# Patient Record
Sex: Female | Born: 1955 | Marital: Single
Health system: Southern US, Community
[De-identification: ages and names within clinical notes are randomized; demographics above are authoritative.]

## PROBLEM LIST (undated history)

## (undated) DIAGNOSIS — I2510 Atherosclerotic heart disease of native coronary artery without angina pectoris: Secondary | ICD-10-CM

## (undated) DIAGNOSIS — I219 Acute myocardial infarction, unspecified: Secondary | ICD-10-CM

## (undated) HISTORY — DX: Atherosclerotic heart disease of native coronary artery without angina pectoris: I25.10

---

## 1999-05-12 ENCOUNTER — Other Ambulatory Visit: Admission: RE | Admit: 1999-05-12 | Discharge: 1999-05-12 | Payer: Self-pay | Admitting: Obstetrics and Gynecology

## 2000-07-11 ENCOUNTER — Other Ambulatory Visit: Admission: RE | Admit: 2000-07-11 | Discharge: 2000-07-11 | Payer: Self-pay | Admitting: Obstetrics and Gynecology

## 2001-08-31 ENCOUNTER — Other Ambulatory Visit: Admission: RE | Admit: 2001-08-31 | Discharge: 2001-08-31 | Payer: Self-pay | Admitting: Obstetrics and Gynecology

## 2002-10-09 ENCOUNTER — Other Ambulatory Visit: Admission: RE | Admit: 2002-10-09 | Discharge: 2002-10-09 | Payer: Self-pay | Admitting: Obstetrics and Gynecology

## 2003-10-10 ENCOUNTER — Other Ambulatory Visit: Admission: RE | Admit: 2003-10-10 | Discharge: 2003-10-10 | Payer: Self-pay | Admitting: Obstetrics and Gynecology

## 2004-10-21 ENCOUNTER — Other Ambulatory Visit: Admission: RE | Admit: 2004-10-21 | Discharge: 2004-10-21 | Payer: Self-pay | Admitting: Obstetrics and Gynecology

## 2005-10-25 ENCOUNTER — Other Ambulatory Visit: Admission: RE | Admit: 2005-10-25 | Discharge: 2005-10-25 | Payer: Self-pay | Admitting: Obstetrics and Gynecology

## 2011-01-20 ENCOUNTER — Ambulatory Visit: Payer: BC Managed Care – PPO | Admitting: Physical Therapy

## 2015-03-18 ENCOUNTER — Other Ambulatory Visit: Payer: Self-pay | Admitting: Obstetrics and Gynecology

## 2015-03-18 DIAGNOSIS — Z780 Asymptomatic menopausal state: Secondary | ICD-10-CM

## 2015-03-18 DIAGNOSIS — R1011 Right upper quadrant pain: Secondary | ICD-10-CM

## 2015-03-25 ENCOUNTER — Ambulatory Visit
Admission: RE | Admit: 2015-03-25 | Discharge: 2015-03-25 | Disposition: A | Discharge status: Home / Self Care | Payer: BLUE CROSS/BLUE SHIELD | Source: Ambulatory Visit | Attending: Obstetrics and Gynecology | Admitting: Obstetrics and Gynecology

## 2015-03-25 ENCOUNTER — Other Ambulatory Visit: Payer: Self-pay

## 2015-03-25 DIAGNOSIS — Z780 Asymptomatic menopausal state: Secondary | ICD-10-CM

## 2019-10-01 ENCOUNTER — Other Ambulatory Visit: Payer: Self-pay

## 2019-10-01 DIAGNOSIS — Z20822 Contact with and (suspected) exposure to covid-19: Secondary | ICD-10-CM

## 2019-10-03 LAB — NOVEL CORONAVIRUS, NAA: SARS-CoV-2, NAA: NOT DETECTED

## 2020-06-11 ENCOUNTER — Other Ambulatory Visit: Payer: Self-pay | Admitting: Obstetrics & Gynecology

## 2020-06-11 DIAGNOSIS — R928 Other abnormal and inconclusive findings on diagnostic imaging of breast: Secondary | ICD-10-CM

## 2020-06-19 ENCOUNTER — Ambulatory Visit
Admission: RE | Admit: 2020-06-19 | Discharge: 2020-06-19 | Disposition: A | Discharge status: Home / Self Care | Payer: BLUE CROSS/BLUE SHIELD | Source: Ambulatory Visit | Attending: Obstetrics & Gynecology | Admitting: Obstetrics & Gynecology

## 2020-06-19 ENCOUNTER — Ambulatory Visit: Payer: BLUE CROSS/BLUE SHIELD

## 2020-06-19 ENCOUNTER — Other Ambulatory Visit: Payer: Self-pay

## 2020-06-19 DIAGNOSIS — R928 Other abnormal and inconclusive findings on diagnostic imaging of breast: Secondary | ICD-10-CM

## 2020-09-09 ENCOUNTER — Encounter: Payer: Self-pay | Admitting: Physician Assistant

## 2020-09-09 ENCOUNTER — Telehealth: Payer: Self-pay | Admitting: Physician Assistant

## 2020-09-09 DIAGNOSIS — I2510 Atherosclerotic heart disease of native coronary artery without angina pectoris: Secondary | ICD-10-CM | POA: Insufficient documentation

## 2020-09-09 NOTE — Telephone Encounter (Signed)
Called to discuss with patient about Covid symptoms and the use of casirivimab/imdevimab, a monoclonal antibody infusion for those with mild to moderate Covid symptoms and at a high risk of hospitalization.  Pt is qualified for this infusion at the [facility] due to; Specific high risk criteria : Cardiovascular disease or hypertension   Message left to call back our hotline [phone] and sent mychart msg.  Cline Crock PA-C  [facility]

## 2020-09-10 ENCOUNTER — Other Ambulatory Visit: Payer: Self-pay | Admitting: Physician Assistant

## 2020-09-10 ENCOUNTER — Other Ambulatory Visit (HOSPITAL_COMMUNITY): Payer: Self-pay

## 2020-09-10 ENCOUNTER — Telehealth: Payer: Self-pay | Admitting: Physician Assistant

## 2020-09-10 DIAGNOSIS — I2510 Atherosclerotic heart disease of native coronary artery without angina pectoris: Secondary | ICD-10-CM

## 2020-09-10 DIAGNOSIS — U071 COVID-19: Secondary | ICD-10-CM

## 2020-09-10 DIAGNOSIS — I10 Essential (primary) hypertension: Secondary | ICD-10-CM

## 2020-09-10 NOTE — Telephone Encounter (Signed)
I connected by phone with Elizabeth Parker on 09/10/2020 at 4:33 PM to discuss the potential use of a new treatment for mild to moderate COVID-19 viral infection in non-hospitalized patients.  This patient is a 64 y.o. female that meets the FDA criteria for Emergency Use Authorization of COVID monoclonal antibody casirivimab/imdevimab.  Has a (+) direct SARS-CoV-2 viral test result  Has mild or moderate COVID-19   Is NOT hospitalized due to COVID-19  Is within 10 days of symptom onset  Has at least one of the high risk factor(s) for progression to severe COVID-19 and/or hospitalization as defined in EUA.  Specific high risk criteria : Cardiovascular disease or hypertension   I have spoken and communicated the following to the patient or parent/caregiver regarding COVID monoclonal antibody treatment:  1. FDA has authorized the emergency use for the treatment of mild to moderate COVID-19 in adults and pediatric patients with positive results of direct SARS-CoV-2 viral testing who are 79 years of age and older weighing at least 40 kg, and who are at high risk for progressing to severe COVID-19 and/or hospitalization.  2. The significant known and potential risks and benefits of COVID monoclonal antibody, and the extent to which such potential risks and benefits are unknown.  3. Information on available alternative treatments and the risks and benefits of those alternatives, including clinical trials.  4. Patients treated with COVID monoclonal antibody should continue to self-isolate and use infection control measures (e.g., wear mask, isolate, social distance, avoid sharing personal items, clean and disinfect high touch surfaces, and frequent handwashing) according to CDC guidelines.   5. The patient or parent/caregiver has the option to accept or refuse COVID monoclonal antibody treatment.  After reviewing this information with the patient, the patient has agreed to receive one of  the available covid 19 monoclonal antibodies and will be provided an appropriate fact sheet prior to infusion.  Sx onset 10/2. Set up for infusion on 10/8 @ 8:30am . Directions given to [facility]. Pt is aware that insurance will be charged an infusion fee. Pt is unvaccinated.   Cline Crock 09/10/2020 4:33 PM

## 2020-09-11 ENCOUNTER — Ambulatory Visit (HOSPITAL_COMMUNITY)
Admission: RE | Admit: 2020-09-11 | Discharge: 2020-09-11 | Disposition: A | Discharge status: Home / Self Care | Payer: BLUE CROSS/BLUE SHIELD | Source: Ambulatory Visit | Attending: Pulmonary Disease | Admitting: Pulmonary Disease

## 2020-09-11 DIAGNOSIS — U071 COVID-19: Secondary | ICD-10-CM | POA: Diagnosis present

## 2020-09-11 DIAGNOSIS — I10 Essential (primary) hypertension: Secondary | ICD-10-CM | POA: Insufficient documentation

## 2020-09-11 DIAGNOSIS — I2510 Atherosclerotic heart disease of native coronary artery without angina pectoris: Secondary | ICD-10-CM | POA: Diagnosis present

## 2020-09-11 MED ORDER — ALBUTEROL SULFATE HFA 108 (90 BASE) MCG/ACT IN AERS: 2.0000 | INHALATION_SPRAY | Freq: Once | RESPIRATORY_TRACT | Status: DC | PRN

## 2020-09-11 MED ORDER — METHYLPREDNISOLONE SODIUM SUCC 125 MG IJ SOLR: 125.0000 mg | Freq: Once | INTRAMUSCULAR | Status: DC | PRN

## 2020-09-11 MED ORDER — SODIUM CHLORIDE 0.9 % IV SOLN: Freq: Once | INTRAVENOUS | Status: AC

## 2020-09-11 MED ORDER — FAMOTIDINE IN NACL 20-0.9 MG/50ML-% IV SOLN: 20.0000 mg | Freq: Once | INTRAVENOUS | Status: DC | PRN

## 2020-09-11 MED ORDER — EPINEPHRINE 0.3 MG/0.3ML IJ SOAJ: 0.3000 mg | Freq: Once | INTRAMUSCULAR | Status: DC | PRN

## 2020-09-11 MED ORDER — SODIUM CHLORIDE 0.9 % IV SOLN: INTRAVENOUS | Status: DC | PRN

## 2020-09-11 MED ORDER — DIPHENHYDRAMINE HCL 50 MG/ML IJ SOLN: 50.0000 mg | Freq: Once | INTRAMUSCULAR | Status: DC | PRN

## 2020-09-11 NOTE — Progress Notes (Signed)
  Diagnosis: COVID-19   Physician: dr Delford Field   Procedure: [facility] Med: casirivimab\imdevimab infusion - Provided patient with casirivimab\imdevimab fact sheet for patients, parents and caregivers prior to infusion.  Complications: No immediate complications noted.  Discharge: Discharged home   Shaune Spittle 09/11/2020

## 2020-09-11 NOTE — Discharge Instructions (Signed)

## 2020-11-05 IMAGING — MG MM DIGITAL DIAGNOSTIC UNILAT*L* W/ TOMO W/ CAD
4 series · 4 of 12 positions shown · non-contrast
Comparison: Previous exam(s).

CLINICAL DATA: Patient was called back from 2D mammogram for
possible asymmetry in the left breast.

EXAM:
DIGITAL DIAGNOSTIC UNILATERAL LEFT MAMMOGRAM WITH TOMO AND CAD

[L CC synth-2D]
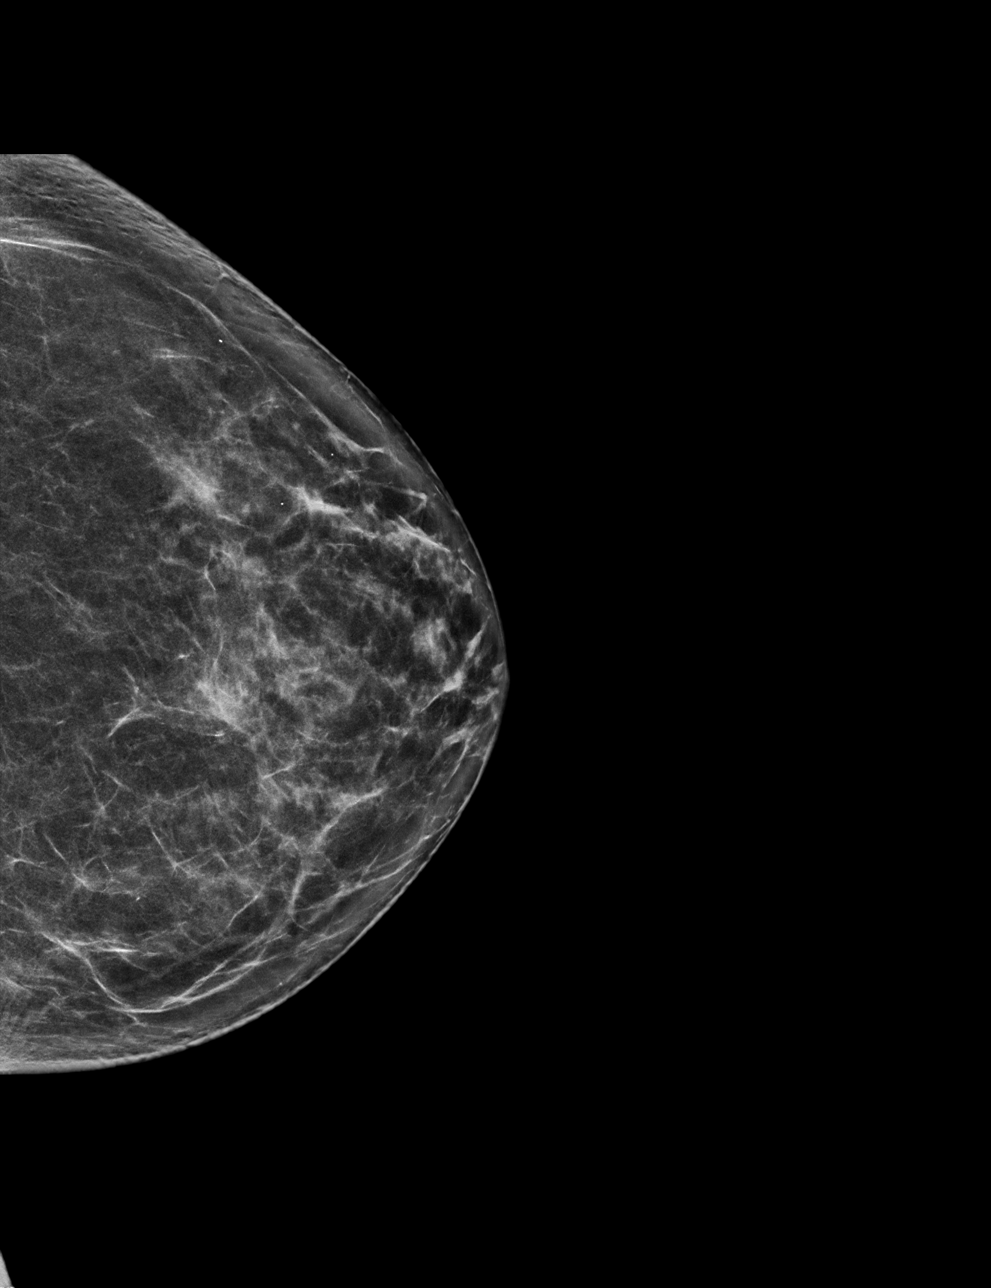

[L MLO synth-2D]
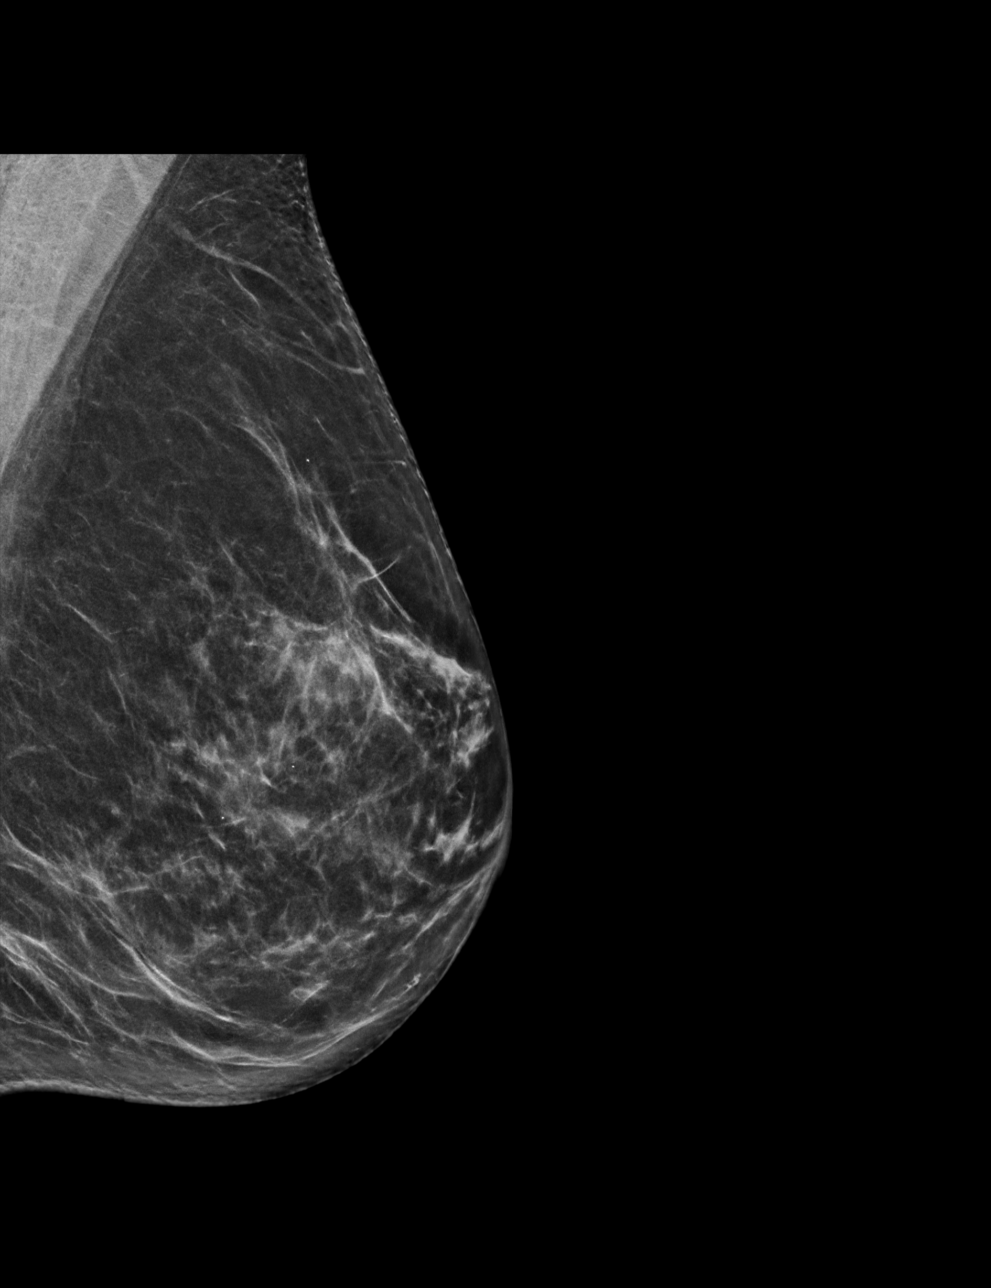

[L CC tomo · tomo slice 34/67.0]
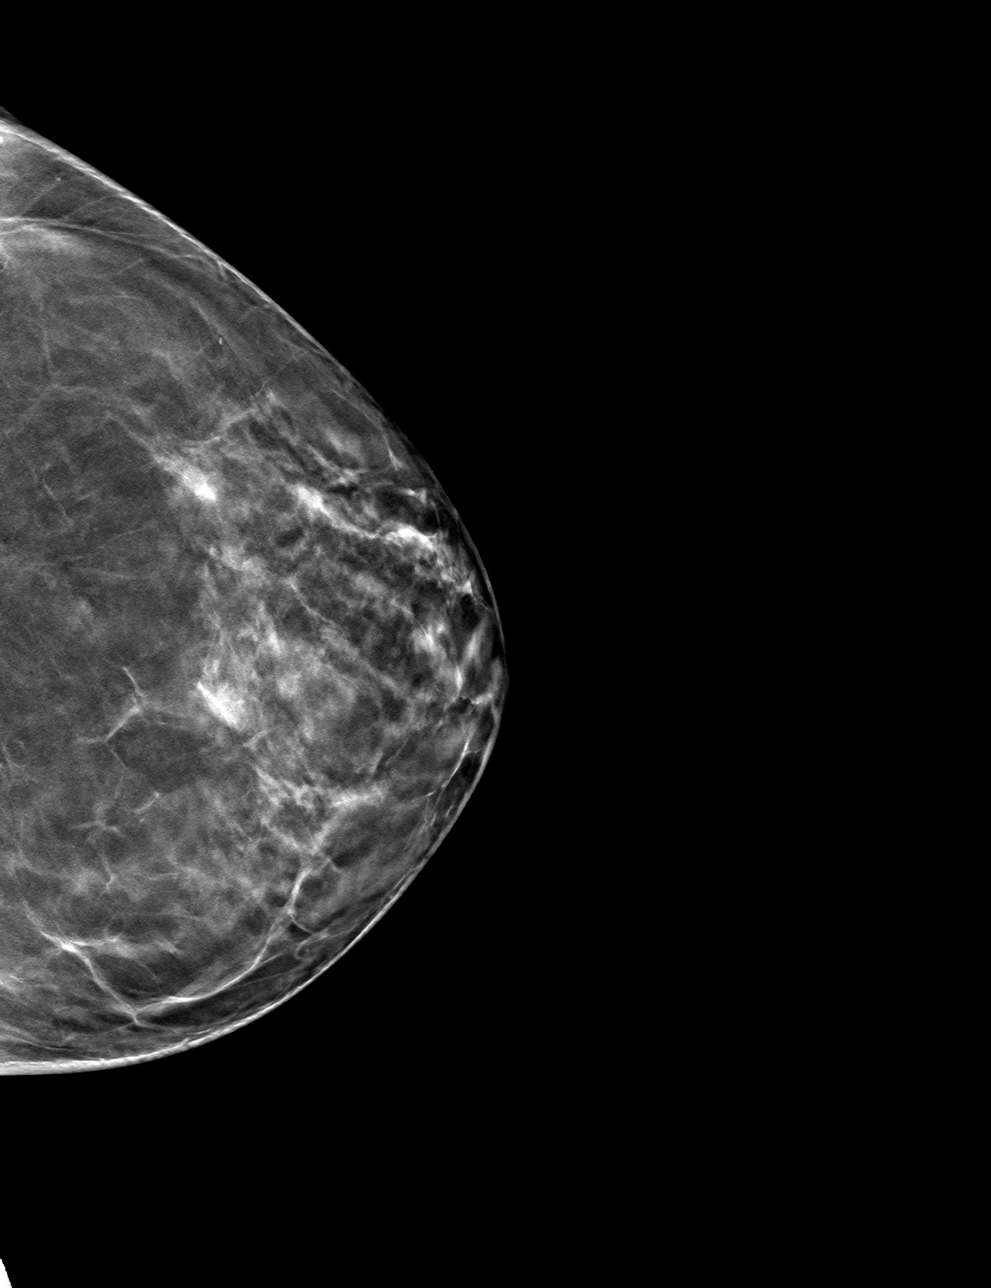

[L MLO tomo · tomo slice 31/61.0]
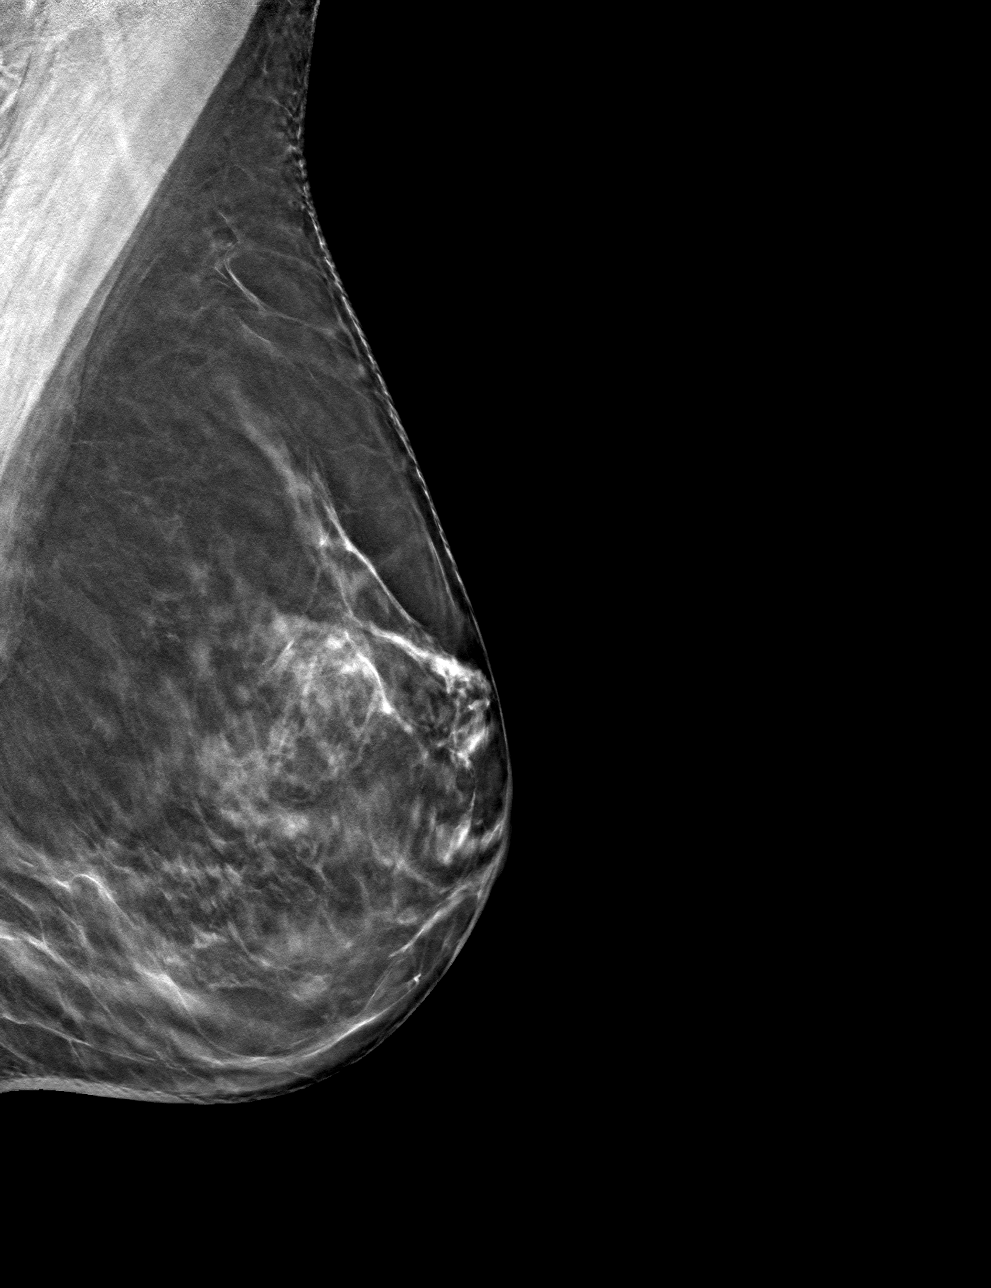

[4 of 12 positions shown; findings below may reference images not displayed]

ACR Breast Density Category b: There are scattered areas of
fibroglandular density.
FINDINGS: 3D imaging of the left breast was performed. No suspicious mass,
malignant type microcalcifications or distortion detected.

Mammographic images were processed with CAD.
IMPRESSION: No evidence of malignancy in the left breast.

RECOMMENDATION:
Bilateral screening mammogram in 1 year is recommended.

I have discussed the findings and recommendations with the patient.
If applicable, a reminder letter will be sent to the patient
regarding the next appointment.

BI-RADS CATEGORY  1: Negative.

## 2022-06-01 DIAGNOSIS — Z955 Presence of coronary angioplasty implant and graft: Secondary | ICD-10-CM | POA: Diagnosis not present

## 2022-06-01 DIAGNOSIS — I252 Old myocardial infarction: Secondary | ICD-10-CM | POA: Diagnosis not present

## 2022-06-01 DIAGNOSIS — E7800 Pure hypercholesterolemia, unspecified: Secondary | ICD-10-CM | POA: Diagnosis not present

## 2022-06-01 DIAGNOSIS — I2510 Atherosclerotic heart disease of native coronary artery without angina pectoris: Secondary | ICD-10-CM | POA: Diagnosis not present

## 2022-07-21 DIAGNOSIS — Z6822 Body mass index (BMI) 22.0-22.9, adult: Secondary | ICD-10-CM | POA: Diagnosis not present

## 2022-07-21 DIAGNOSIS — Z01419 Encounter for gynecological examination (general) (routine) without abnormal findings: Secondary | ICD-10-CM | POA: Diagnosis not present

## 2022-07-21 DIAGNOSIS — Z1231 Encounter for screening mammogram for malignant neoplasm of breast: Secondary | ICD-10-CM | POA: Diagnosis not present

## 2022-08-15 DIAGNOSIS — M8580 Other specified disorders of bone density and structure, unspecified site: Secondary | ICD-10-CM | POA: Diagnosis not present

## 2022-08-15 DIAGNOSIS — M8589 Other specified disorders of bone density and structure, multiple sites: Secondary | ICD-10-CM | POA: Diagnosis not present

## 2022-08-15 DIAGNOSIS — E2839 Other primary ovarian failure: Secondary | ICD-10-CM | POA: Diagnosis not present

## 2022-11-21 DIAGNOSIS — Z1211 Encounter for screening for malignant neoplasm of colon: Secondary | ICD-10-CM | POA: Diagnosis not present

## 2022-11-29 LAB — EXTERNAL GENERIC LAB PROCEDURE: COLOGUARD: NEGATIVE

## 2022-11-29 LAB — COLOGUARD: COLOGUARD: NEGATIVE

## 2022-11-30 DIAGNOSIS — Z112 Encounter for screening for other bacterial diseases: Secondary | ICD-10-CM | POA: Diagnosis not present

## 2023-01-23 DIAGNOSIS — H5203 Hypermetropia, bilateral: Secondary | ICD-10-CM | POA: Diagnosis not present

## 2023-01-23 DIAGNOSIS — H524 Presbyopia: Secondary | ICD-10-CM | POA: Diagnosis not present

## 2023-01-23 DIAGNOSIS — H52223 Regular astigmatism, bilateral: Secondary | ICD-10-CM | POA: Diagnosis not present

## 2023-01-30 DIAGNOSIS — L821 Other seborrheic keratosis: Secondary | ICD-10-CM | POA: Diagnosis not present

## 2023-01-30 DIAGNOSIS — L720 Epidermal cyst: Secondary | ICD-10-CM | POA: Diagnosis not present

## 2023-01-30 DIAGNOSIS — D1801 Hemangioma of skin and subcutaneous tissue: Secondary | ICD-10-CM | POA: Diagnosis not present

## 2023-04-12 DIAGNOSIS — I493 Ventricular premature depolarization: Secondary | ICD-10-CM | POA: Diagnosis not present

## 2023-04-12 DIAGNOSIS — I252 Old myocardial infarction: Secondary | ICD-10-CM | POA: Diagnosis not present

## 2023-04-12 DIAGNOSIS — E7800 Pure hypercholesterolemia, unspecified: Secondary | ICD-10-CM | POA: Diagnosis not present

## 2023-04-12 DIAGNOSIS — I499 Cardiac arrhythmia, unspecified: Secondary | ICD-10-CM | POA: Diagnosis not present

## 2023-04-12 DIAGNOSIS — R002 Palpitations: Secondary | ICD-10-CM | POA: Diagnosis not present

## 2023-06-07 DIAGNOSIS — R002 Palpitations: Secondary | ICD-10-CM | POA: Diagnosis not present

## 2023-06-10 DIAGNOSIS — R002 Palpitations: Secondary | ICD-10-CM | POA: Diagnosis not present

## 2023-07-10 DIAGNOSIS — L821 Other seborrheic keratosis: Secondary | ICD-10-CM | POA: Diagnosis not present

## 2023-07-10 DIAGNOSIS — X32XXXS Exposure to sunlight, sequela: Secondary | ICD-10-CM | POA: Diagnosis not present

## 2023-07-10 DIAGNOSIS — L820 Inflamed seborrheic keratosis: Secondary | ICD-10-CM | POA: Diagnosis not present

## 2023-07-10 DIAGNOSIS — L814 Other melanin hyperpigmentation: Secondary | ICD-10-CM | POA: Diagnosis not present

## 2023-07-25 DIAGNOSIS — Z1231 Encounter for screening mammogram for malignant neoplasm of breast: Secondary | ICD-10-CM | POA: Diagnosis not present

## 2023-07-27 DIAGNOSIS — Z0000 Encounter for general adult medical examination without abnormal findings: Secondary | ICD-10-CM | POA: Diagnosis not present

## 2023-07-27 DIAGNOSIS — E039 Hypothyroidism, unspecified: Secondary | ICD-10-CM | POA: Diagnosis not present

## 2023-07-27 DIAGNOSIS — E7800 Pure hypercholesterolemia, unspecified: Secondary | ICD-10-CM | POA: Diagnosis not present

## 2023-09-04 DIAGNOSIS — R053 Chronic cough: Secondary | ICD-10-CM | POA: Diagnosis not present

## 2024-02-19 ENCOUNTER — Ambulatory Visit: Payer: BLUE CROSS/BLUE SHIELD | Admitting: Cardiology

## 2024-12-02 ENCOUNTER — Emergency Department (HOSPITAL_COMMUNITY)
Admission: EM | Admit: 2024-12-02 | Discharge: 2024-12-02 | Discharge status: Against Medical Advice | Attending: Emergency Medicine | Admitting: Emergency Medicine

## 2024-12-02 ENCOUNTER — Other Ambulatory Visit: Payer: Self-pay

## 2024-12-02 ENCOUNTER — Encounter (HOSPITAL_COMMUNITY): Payer: Self-pay

## 2024-12-02 DIAGNOSIS — H538 Other visual disturbances: Secondary | ICD-10-CM | POA: Diagnosis present

## 2024-12-02 DIAGNOSIS — Z5321 Procedure and treatment not carried out due to patient leaving prior to being seen by health care provider: Secondary | ICD-10-CM | POA: Diagnosis not present

## 2024-12-02 HISTORY — DX: Acute myocardial infarction, unspecified: I21.9

## 2024-12-02 LAB — CBC WITH DIFFERENTIAL/PLATELET
Abs Immature Granulocytes: 0.02 K/uL (ref 0.00–0.07)
Basophils Absolute: 0 K/uL (ref 0.0–0.1)
Basophils Relative: 1 %
Eosinophils Absolute: 0 K/uL (ref 0.0–0.5)
Eosinophils Relative: 0 %
HCT: 40.4 % (ref 36.0–46.0)
Hemoglobin: 13.3 g/dL (ref 12.0–15.0)
Immature Granulocytes: 0 %
Lymphocytes Relative: 30 %
Lymphs Abs: 2.1 K/uL (ref 0.7–4.0)
MCH: 29.6 pg (ref 26.0–34.0)
MCHC: 32.9 g/dL (ref 30.0–36.0)
MCV: 89.8 fL (ref 80.0–100.0)
Monocytes Absolute: 0.4 K/uL (ref 0.1–1.0)
Monocytes Relative: 6 %
Neutro Abs: 4.4 K/uL (ref 1.7–7.7)
Neutrophils Relative %: 63 %
Platelets: 199 K/uL (ref 150–400)
RBC: 4.5 MIL/uL (ref 3.87–5.11)
RDW: 12.2 % (ref 11.5–15.5)
WBC: 7 K/uL (ref 4.0–10.5)
nRBC: 0 % (ref 0.0–0.2)

## 2024-12-02 LAB — BASIC METABOLIC PANEL WITH GFR
Anion gap: 12 (ref 5–15)
BUN: 12 mg/dL (ref 8–23)
CO2: 22 mmol/L (ref 22–32)
Calcium: 8.9 mg/dL (ref 8.9–10.3)
Chloride: 104 mmol/L (ref 98–111)
Creatinine, Ser: 0.77 mg/dL (ref 0.44–1.00)
GFR, Estimated: >60 mL/min
Glucose, Bld: 111 mg/dL — ABNORMAL HIGH (ref 70–99)
Potassium: 4 mmol/L (ref 3.5–5.1)
Sodium: 138 mmol/L (ref 135–145)

## 2024-12-02 NOTE — ED Notes (Signed)
 Pt did not answer a room call. Pt marked as eloped.

## 2024-12-02 NOTE — ED Provider Triage Note (Signed)
 Emergency Medicine Provider Triage Evaluation Note  Elizabeth Parker , a 68 y.o. female  was evaluated in triage.  Pt complains of visual changes. Progressive blurry vision involving L eye for several week.  Seen by optometrist recently who felt pt need brain MRI and to f/u with neurology for further evaluation.  Sts she tried contacting neurologist but appointment is not available for several months.  Does wear contact lens.  Denies headache, pain with eye movement, diplopia, or complete loss of vision.  No trauma  Review of Systems  Positive: As above Negative: As above  Physical Exam  BP (!) 146/81 (BP Location: Right Arm)   Pulse 75   Temp 98.4 F (36.9 C) (Oral)   Resp 16   SpO2 99%  Gen:   Awake, no distress   Resp:  Normal effort  MSK:   Moves extremities without difficulty  Other:    Medical Decision Making  Medically screening exam initiated at 2:16 PM.  Appropriate orders placed.  Elizabeth Parker was informed that the remainder of the evaluation will be completed by another provider, this initial triage assessment does not replace that evaluation, and the importance of remaining in the ED until their evaluation is complete.     Nivia Colon, PA-C 12/02/24 [phone]

## 2024-12-02 NOTE — ED Triage Notes (Signed)
 Patient states that she lost vision in her left eye about 2-3 weeks ago. She reports that she can still see out of it but it is continuously getting dimmer.  She reports there is blurriness in the corner of her eye. Denies any other symptoms

## 2025-01-31 ENCOUNTER — Ambulatory Visit: Admitting: Diagnostic Neuroimaging
